# Patient Record
Sex: Female | Born: 1970 | Race: Black or African American | Hispanic: No | Marital: Single | Smoking: Never smoker
Health system: Southern US, Community
[De-identification: ages and names within clinical notes are randomized; demographics above are authoritative.]

## PROBLEM LIST (undated history)

## (undated) DIAGNOSIS — I10 Essential (primary) hypertension: Secondary | ICD-10-CM

## (undated) DIAGNOSIS — E119 Type 2 diabetes mellitus without complications: Secondary | ICD-10-CM

---

## 2017-12-08 ENCOUNTER — Emergency Department (HOSPITAL_COMMUNITY): Payer: Medicaid - Out of State

## 2017-12-08 ENCOUNTER — Other Ambulatory Visit: Payer: Self-pay

## 2017-12-08 ENCOUNTER — Encounter (HOSPITAL_COMMUNITY): Payer: Self-pay | Admitting: Emergency Medicine

## 2017-12-08 ENCOUNTER — Inpatient Hospital Stay (HOSPITAL_COMMUNITY)
Admission: EM | Admit: 2017-12-08 | Discharge: 2017-12-10 | DRG: 871 | Disposition: A | Discharge status: Home / Self Care | Payer: Medicaid - Out of State | Attending: Internal Medicine | Admitting: Internal Medicine

## 2017-12-08 DIAGNOSIS — B974 Respiratory syncytial virus as the cause of diseases classified elsewhere: Secondary | ICD-10-CM | POA: Diagnosis present

## 2017-12-08 DIAGNOSIS — I10 Essential (primary) hypertension: Secondary | ICD-10-CM

## 2017-12-08 DIAGNOSIS — J181 Lobar pneumonia, unspecified organism: Secondary | ICD-10-CM | POA: Diagnosis present

## 2017-12-08 DIAGNOSIS — Z79899 Other long term (current) drug therapy: Secondary | ICD-10-CM | POA: Diagnosis not present

## 2017-12-08 DIAGNOSIS — E876 Hypokalemia: Secondary | ICD-10-CM | POA: Diagnosis present

## 2017-12-08 DIAGNOSIS — Z6841 Body Mass Index (BMI) 40.0 and over, adult: Secondary | ICD-10-CM | POA: Diagnosis not present

## 2017-12-08 DIAGNOSIS — E119 Type 2 diabetes mellitus without complications: Secondary | ICD-10-CM | POA: Diagnosis present

## 2017-12-08 DIAGNOSIS — Z880 Allergy status to penicillin: Secondary | ICD-10-CM | POA: Diagnosis not present

## 2017-12-08 DIAGNOSIS — Z7984 Long term (current) use of oral hypoglycemic drugs: Secondary | ICD-10-CM | POA: Diagnosis not present

## 2017-12-08 DIAGNOSIS — R0602 Shortness of breath: Secondary | ICD-10-CM | POA: Diagnosis present

## 2017-12-08 DIAGNOSIS — I509 Heart failure, unspecified: Secondary | ICD-10-CM | POA: Diagnosis not present

## 2017-12-08 DIAGNOSIS — A419 Sepsis, unspecified organism: Secondary | ICD-10-CM | POA: Diagnosis present

## 2017-12-08 DIAGNOSIS — J9601 Acute respiratory failure with hypoxia: Secondary | ICD-10-CM | POA: Diagnosis present

## 2017-12-08 HISTORY — DX: Essential (primary) hypertension: I10

## 2017-12-08 HISTORY — DX: Type 2 diabetes mellitus without complications: E11.9

## 2017-12-08 LAB — CBC WITH DIFFERENTIAL/PLATELET
BASOS ABS: 0 10*3/uL (ref 0.0–0.1)
Basophils Relative: 0 %
EOS ABS: 0 10*3/uL (ref 0.0–0.7)
Eosinophils Relative: 0 %
HCT: 40.7 % (ref 36.0–46.0)
HEMOGLOBIN: 13.1 g/dL (ref 12.0–15.0)
LYMPHS ABS: 1.5 10*3/uL (ref 0.7–4.0)
LYMPHS PCT: 11 %
MCH: 29.1 pg (ref 26.0–34.0)
MCHC: 32.2 g/dL (ref 30.0–36.0)
MCV: 90.4 fL (ref 78.0–100.0)
Monocytes Absolute: 1.1 10*3/uL — ABNORMAL HIGH (ref 0.1–1.0)
Monocytes Relative: 8 %
Neutro Abs: 11.4 10*3/uL — ABNORMAL HIGH (ref 1.7–7.7)
Neutrophils Relative %: 81 %
Platelets: 362 10*3/uL (ref 150–400)
RBC: 4.5 MIL/uL (ref 3.87–5.11)
RDW: 13.4 % (ref 11.5–15.5)
WBC: 14.1 10*3/uL — AB (ref 4.0–10.5)

## 2017-12-08 LAB — I-STAT CG4 LACTIC ACID, ED: Lactic Acid, Venous: 1.31 mmol/L (ref 0.5–1.9)

## 2017-12-08 LAB — PROCALCITONIN: PROCALCITONIN: 0.22 ng/mL

## 2017-12-08 LAB — INFLUENZA PANEL BY PCR (TYPE A & B)
INFLAPCR: NEGATIVE
INFLBPCR: NEGATIVE

## 2017-12-08 LAB — PROTIME-INR
INR: 1.18
Prothrombin Time: 14.9 seconds (ref 11.4–15.2)

## 2017-12-08 LAB — BRAIN NATRIURETIC PEPTIDE: B NATRIURETIC PEPTIDE 5: 35 pg/mL (ref 0.0–100.0)

## 2017-12-08 LAB — BASIC METABOLIC PANEL
ANION GAP: 11 (ref 5–15)
BUN: 9 mg/dL (ref 6–20)
CHLORIDE: 103 mmol/L (ref 101–111)
CO2: 22 mmol/L (ref 22–32)
Calcium: 8.5 mg/dL — ABNORMAL LOW (ref 8.9–10.3)
Creatinine, Ser: 0.97 mg/dL (ref 0.44–1.00)
GFR calc Af Amer: >60 mL/min (ref >60)
GFR calc non Af Amer: >60 mL/min (ref >60)
Glucose, Bld: 126 mg/dL — ABNORMAL HIGH (ref 65–99)
Potassium: 3.6 mmol/L (ref 3.5–5.1)
SODIUM: 136 mmol/L (ref 135–145)

## 2017-12-08 LAB — LACTIC ACID, PLASMA
LACTIC ACID, VENOUS: 1.5 mmol/L (ref 0.5–1.9)
Lactic Acid, Venous: 1.3 mmol/L (ref 0.5–1.9)

## 2017-12-08 LAB — APTT: aPTT: 34 seconds (ref 24–36)

## 2017-12-08 MED ORDER — ACETAMINOPHEN 325 MG PO TABS
650.0000 mg | ORAL_TABLET | Freq: Four times a day (QID) | ORAL | Status: DC | PRN
  Administered 2017-12-09 (×2): 650 mg via ORAL
  Filled 2017-12-08 (×2): qty 2

## 2017-12-08 MED ORDER — DEXTROSE 5 % IV SOLN
500.0000 mg | Freq: Once | INTRAVENOUS | Status: AC
  Administered 2017-12-08: 500 mg via INTRAVENOUS
  Filled 2017-12-08: qty 500

## 2017-12-08 MED ORDER — ORAL CARE MOUTH RINSE
15.0000 mL | Freq: Two times a day (BID) | OROMUCOSAL | Status: DC
  Administered 2017-12-09: 15 mL via OROMUCOSAL

## 2017-12-08 MED ORDER — ONDANSETRON HCL 4 MG/2ML IJ SOLN: 4.0000 mg | Freq: Four times a day (QID) | INTRAMUSCULAR | Status: DC | PRN

## 2017-12-08 MED ORDER — ENOXAPARIN SODIUM 80 MG/0.8ML ~~LOC~~ SOLN
80.0000 mg | SUBCUTANEOUS | Status: DC
  Administered 2017-12-08 – 2017-12-09 (×2): 80 mg via SUBCUTANEOUS
  Filled 2017-12-08 (×2): qty 0.8

## 2017-12-08 MED ORDER — ALBUTEROL SULFATE (2.5 MG/3ML) 0.083% IN NEBU
5.0000 mg | INHALATION_SOLUTION | Freq: Once | RESPIRATORY_TRACT | Status: AC
  Administered 2017-12-08: 5 mg via RESPIRATORY_TRACT
  Filled 2017-12-08: qty 6

## 2017-12-08 MED ORDER — CEFTRIAXONE SODIUM 1 G IJ SOLR
1.0000 g | Freq: Once | INTRAMUSCULAR | Status: AC
  Administered 2017-12-08: 1 g via INTRAVENOUS
  Filled 2017-12-08: qty 10

## 2017-12-08 MED ORDER — ONDANSETRON HCL 4 MG PO TABS: 4.0000 mg | ORAL_TABLET | Freq: Four times a day (QID) | ORAL | Status: DC | PRN

## 2017-12-08 MED ORDER — IPRATROPIUM-ALBUTEROL 0.5-2.5 (3) MG/3ML IN SOLN
3.0000 mL | Freq: Once | RESPIRATORY_TRACT | Status: AC
  Administered 2017-12-08: 3 mL via RESPIRATORY_TRACT
  Filled 2017-12-08: qty 3

## 2017-12-08 MED ORDER — DIPHENHYDRAMINE HCL 50 MG/ML IJ SOLN
12.5000 mg | Freq: Once | INTRAMUSCULAR | Status: AC
  Administered 2017-12-08: 12.5 mg via INTRAVENOUS
  Filled 2017-12-08: qty 1

## 2017-12-08 MED ORDER — ALBUTEROL SULFATE (2.5 MG/3ML) 0.083% IN NEBU
2.5000 mg | INHALATION_SOLUTION | Freq: Once | RESPIRATORY_TRACT | Status: AC
  Administered 2017-12-08: 2.5 mg via RESPIRATORY_TRACT
  Filled 2017-12-08: qty 3

## 2017-12-08 MED ORDER — IPRATROPIUM-ALBUTEROL 0.5-2.5 (3) MG/3ML IN SOLN
3.0000 mL | Freq: Four times a day (QID) | RESPIRATORY_TRACT | Status: DC
  Administered 2017-12-08 – 2017-12-10 (×7): 3 mL via RESPIRATORY_TRACT
  Filled 2017-12-08 (×7): qty 3

## 2017-12-08 MED ORDER — METOPROLOL TARTRATE 50 MG PO TABS
50.0000 mg | ORAL_TABLET | Freq: Two times a day (BID) | ORAL | Status: DC
  Administered 2017-12-08 – 2017-12-10 (×4): 50 mg via ORAL
  Filled 2017-12-08 (×4): qty 1

## 2017-12-08 MED ORDER — ACETAMINOPHEN 500 MG PO TABS
1000.0000 mg | ORAL_TABLET | Freq: Once | ORAL | Status: AC
  Administered 2017-12-08: 1000 mg via ORAL
  Filled 2017-12-08: qty 2

## 2017-12-08 MED ORDER — DEXTROSE 5 % IV SOLN: 1.0000 g | Freq: Once | INTRAVENOUS | Status: DC

## 2017-12-08 MED ORDER — AMLODIPINE BESYLATE 5 MG PO TABS
10.0000 mg | ORAL_TABLET | Freq: Every day | ORAL | Status: DC
  Administered 2017-12-09 – 2017-12-10 (×2): 10 mg via ORAL
  Filled 2017-12-08 (×2): qty 2

## 2017-12-08 MED ORDER — DEXTROSE 5 % IV SOLN: 500.0000 mg | Freq: Once | INTRAVENOUS | Status: DC

## 2017-12-08 MED ORDER — DEXTROSE 5 % IV SOLN
1.0000 g | INTRAVENOUS | Status: DC
  Administered 2017-12-09: 1 g via INTRAVENOUS
  Filled 2017-12-08 (×4): qty 10

## 2017-12-08 MED ORDER — VITAMIN D 1000 UNITS PO TABS
2000.0000 [IU] | ORAL_TABLET | Freq: Every day | ORAL | Status: DC
  Administered 2017-12-08 – 2017-12-10 (×3): 2000 [IU] via ORAL
  Filled 2017-12-08 (×3): qty 2

## 2017-12-08 MED ORDER — FUROSEMIDE 10 MG/ML IJ SOLN
40.0000 mg | Freq: Once | INTRAMUSCULAR | Status: AC
  Administered 2017-12-08: 40 mg via INTRAVENOUS
  Filled 2017-12-08: qty 4

## 2017-12-08 MED ORDER — AZITHROMYCIN 500 MG IV SOLR
500.0000 mg | INTRAVENOUS | Status: DC
  Administered 2017-12-09: 500 mg via INTRAVENOUS
  Filled 2017-12-08 (×3): qty 500

## 2017-12-08 MED ORDER — PROCHLORPERAZINE EDISYLATE 5 MG/ML IJ SOLN
5.0000 mg | Freq: Once | INTRAMUSCULAR | Status: AC
  Administered 2017-12-08: 5 mg via INTRAVENOUS
  Filled 2017-12-08: qty 2

## 2017-12-08 MED ORDER — ACETAMINOPHEN 650 MG RE SUPP: 650.0000 mg | Freq: Four times a day (QID) | RECTAL | Status: DC | PRN

## 2017-12-08 NOTE — ED Triage Notes (Signed)
Patient complaining of cough, wheezing, and shortness of breath x 1 week. States she is coughing up white phlegm.

## 2017-12-08 NOTE — ED Provider Notes (Signed)
[facility] EMERGENCY DEPARTMENT Provider Note   CSN: [id number] Arrival date & time: 12/08/17  1242     History   Chief Complaint Chief Complaint  Patient presents with  . Respiratory Distress    HPI Misty Delgado is a 47 y.o. female.  Planes of shortness of breath and cough productive of white sputum for the past 7 days.  She was seen at a clinic a few days ago, started on erythromycin, without relief.  She denies any nausea or vomiting.  Associated symptoms include fever.  No other associated symptoms.  Patient was started on erythromycin 3 days ago for same complaint.  HPI  Past Medical History:  Diagnosis Date  . Diabetes mellitus without complication (HCC)   . Hypertension     There are no active problems to display for this patient.   History reviewed. No pertinent surgical history.  OB History    No data available       Home Medications    Prior to Admission medications   Medication Sig Start Date End Date Taking? Authorizing Provider  AmLODIPine Besylate (NORVASC PO) amlodipine    [provider]  azithromycin (ZITHROMAX) 250 MG tablet Take 1 tablet by mouth as directed. 12/07/17   [provider]  benzonatate (TESSALON) 100 MG capsule  12/06/17   [provider]  doxycycline (VIBRA-TABS) 100 MG tablet Take 1 tablet by mouth 2 (two) times daily.    [provider]  GlipiZIDE (GLUCOTROL PO) glipizide    [provider]  Metoprolol-Hydrochlorothiazide 100-12.5 MG TB24 Take 1 tablet by mouth daily.    [provider]  PROAIR HFA 108 [phone] Base) MCG/ACT inhaler  12/07/17   [provider]  promethazine-dextromethorphan (PROMETHAZINE-DM) 6.25-15 MG/5ML syrup  12/07/17   [provider]    Family History History reviewed. No pertinent family history.  Social History Social History   Tobacco Use  . Smoking status: Never Smoker  . Smokeless tobacco: Never Used  Substance Use Topics  .  Alcohol use: No    Frequency: Never  . Drug use: No     Allergies   Penicillins   Review of Systems Review of Systems  Constitutional: Negative.   HENT: Negative.   Respiratory: Positive for cough and shortness of breath.   Cardiovascular: Positive for leg swelling.       Chronic bilateral leg edema  Gastrointestinal: Negative.   Musculoskeletal: Negative.   Skin: Negative.   Allergic/Immunologic: Positive for immunocompromised state.  Neurological: Negative.   Psychiatric/Behavioral: Negative.   All other systems reviewed and are negative.    Physical Exam Updated Vital Signs BP (!) 190/103   Pulse (!) 103   Temp (!) 103.9 F (39.9 C) (Rectal)   Resp (!) 25   Ht 5\' 4"  (1.626 m)   Wt (!) 161.9 kg (357 lb)   LMP 11/22/2017   SpO2 97%   BMI 61.28 kg/m   Physical Exam  Constitutional: She appears well-developed and well-nourished. She appears distressed.  Moderately ill-appearing  HENT:  Head: Normocephalic and atraumatic.  Eyes: Conjunctivae are normal. Pupils are equal, round, and reactive to light.  Neck: Neck supple. No tracheal deviation present. No thyromegaly present.  Cardiovascular: Regular rhythm.  No murmur heard. Tachycardic  Pulmonary/Chest: She has wheezes.  Coughing frequently.  Expiratory wheezes  Abdominal: Soft. Bowel sounds are normal. She exhibits no distension. There is no tenderness.  Morbidly obese  Musculoskeletal: Normal range of motion. She exhibits edema. She exhibits no tenderness.  Plus 1pretibial pitting edema bilaterally  Neurological: She is alert. Coordination normal.  Skin: Skin is warm and dry. No rash noted.  Psychiatric: She has a normal mood and affect.  Nursing note and vitals reviewed.    ED Treatments / Results  Labs (all labs ordered are listed, but only abnormal results are displayed) Labs Reviewed  CULTURE, BLOOD (ROUTINE X 2)  CULTURE, BLOOD (ROUTINE X 2)  BRAIN NATRIURETIC PEPTIDE  CBC WITH  DIFFERENTIAL/PLATELET  BASIC METABOLIC PANEL  URINALYSIS, ROUTINE W REFLEX MICROSCOPIC  INFLUENZA PANEL BY PCR (TYPE A & B)  I-STAT CG4 LACTIC ACID, ED    EKG  EKG Interpretation  Date/Time:  Thursday December 08 2017 13:05:00 EST Ventricular Rate:  101 PR Interval:    QRS Duration: 85 QT Interval:  329 QTC Calculation: 427 R Axis:   70 Text Interpretation:  Sinus tachycardia No old tracing to compare Confirmed by [place]ubowitz, Doreatha MartinSam [id number]) on 12/08/2017 1:28:50 PM       Radiology No results found.  Procedures Procedures (including critical care time)  Medications Ordered in ED Medications  albuterol (PROVENTIL) (2.5 MG/3ML) 0.083% nebulizer solution 5 mg (not administered)  cefTRIAXone (ROCEPHIN) 1 g in dextrose 5 % 50 mL IVPB (not administered)  azithromycin (ZITHROMAX) 500 mg in dextrose 5 % 250 mL IVPB (not administered)  acetaminophen (TYLENOL) tablet 1,000 mg (not administered)  ipratropium-albuterol (DUONEB) 0.5-2.5 (3) MG/3ML nebulizer solution 3 mL (3 mLs Nebulization Given 12/08/17 1305)  albuterol (PROVENTIL) (2.5 MG/3ML) 0.083% nebulizer solution 2.5 mg (2.5 mg Nebulization Given 12/08/17 1305)   Chest x-ray viewed by me Results for orders placed or performed during the hospital encounter of 12/08/17  Brain natriuretic peptide  Result Value Ref Range   B Natriuretic Peptide 35.0 0.0 - 100.0 pg/mL  CBC with Differential/Platelet  Result Value Ref Range   WBC 14.1 (H) 4.0 - 10.5 K/uL   RBC 4.50 3.87 - 5.11 MIL/uL   Hemoglobin 13.1 12.0 - 15.0 g/dL   HCT 47.840.7 29.536.0 - 62.146.0 %   MCV 90.4 78.0 - 100.0 fL   MCH 29.1 26.0 - 34.0 pg   MCHC 32.2 30.0 - 36.0 g/dL   RDW 30.813.4 65.711.5 - 84.615.5 %   Platelets 362 150 - 400 K/uL   Neutrophils Relative % 81 %   Neutro Abs 11.4 (H) 1.7 - 7.7 K/uL   Lymphocytes Relative 11 %   Lymphs Abs 1.5 0.7 - 4.0 K/uL   Monocytes Relative 8 %   Monocytes Absolute 1.1 (H) 0.1 - 1.0 K/uL   Eosinophils Relative 0 %   Eosinophils Absolute 0.0 0.0  - 0.7 K/uL   Basophils Relative 0 %   Basophils Absolute 0.0 0.0 - 0.1 K/uL  Basic metabolic panel  Result Value Ref Range   Sodium 136 135 - 145 mmol/L   Potassium 3.6 3.5 - 5.1 mmol/L   Chloride 103 101 - 111 mmol/L   CO2 22 22 - 32 mmol/L   Glucose, Bld 126 (H) 65 - 99 mg/dL   BUN 9 6 - 20 mg/dL   Creatinine, Ser 9.620.97 0.44 - 1.00 mg/dL   Calcium 8.5 (L) 8.9 - 10.3 mg/dL   GFR calc non Af Amer >60 >60 mL/min   GFR calc Af Amer >60 >60 mL/min   Anion gap 11 5 - 15  I-Stat CG4 Lactic Acid, ED  ([facility])  Result Value Ref Range   Lactic Acid, Venous 1.31 0.5 - 1.9 mmol/L   Dg Chest  2 View  Result Date: 12/08/2017 CLINICAL DATA:  Cough, shortness of breath. EXAM: CHEST  2 VIEW COMPARISON:  None. FINDINGS: Borderline cardiac enlargement is noted with central pulmonary vascular congestion. No pneumothorax or pleural effusion is noted. Mild right basilar subsegmental atelectasis or infiltrate is noted. Bony thorax is unremarkable. IMPRESSION: Central pulmonary vascular congestion is noted. Mild right basilar atelectasis or infiltrate is noted. Followup PA and lateral chest X-ray is recommended in 3-4 weeks following trial of antibiotic therapy to ensure resolution and exclude underlying malignancy. Electronically Signed   By: Lupita Raider, M.D.   On: 12/08/2017 14:05    Initial Impression / Assessment and Plan / ED Course  I have reviewed the triage vital signs and the nursing notes.  Pertinent labs & imaging results that were available during my care of the patient were reviewed by me and considered in my medical decision making (see chart for details).    Code sepsis called based on sirs criteria fever, tachycardia.  Source of infection felt to be respiratory  pulseOximetry on room air consistent with hypoxia Sepsis - Repeat Assessment  Performed at:    3pm  Vitals     Blood pressure (!) 180/90, pulse 100, temperature (!) 103.9 F (39.9 C), temperature source Rectal, resp.  rate (!) 25, height 5\' 4"  (1.626 m), weight (!) 161.9 kg (357 lb), last menstrual period 11/22/2017, SpO2 98 %.  Heart:     Tachycardic  Lungs:    Wheezing  Capillary Refill:   <2 sec  Peripheral Pulse:   Radial pulse palpable  Skin:     Normal Color  3P.m. patient feels improved after treatment with albuterol nebulizer and IV antibiotics  Dr.Tat hospitalist service consulted and will arrange for admission to telemetry  Final Clinical Impressions(s) / ED Diagnoses  Diagnosis #1 sepsis #2 community-acquired pneumonia #3 hypoxia CRITICAL CARE Performed by: Doug Sou Total critical care time: 30 minutes Critical care time was exclusive of separately billable procedures and treating other patients. Critical care was necessary to treat or prevent imminent or life-threatening deterioration. Critical care was time spent personally by me on the following activities: development of treatment plan with patient and/or surrogate as well as nursing, discussions with consultants, evaluation of patient's response to treatment, examination of patient, obtaining history from patient or surrogate, ordering and performing treatments and interventions, ordering and review of laboratory studies, ordering and review of radiographic studies, pulse oximetry and re-evaluation of patient's condition. Final diagnoses:  None    ED Discharge Orders    None       Doug Sou, MD 12/08/17 1510

## 2017-12-08 NOTE — ED Notes (Signed)
Patient transported to X-ray 

## 2017-12-08 NOTE — H&P (Signed)
History and Physical  Misty Delgado ZOX:[id number] DOB: 06-12-1971 DOA: 12/08/2017   PCP: System, Pcp Not In   Patient coming from: Home  Chief Complaint: sob, cough  HPI:  Misty Delgado is a 47 y.o. female with medical history of hypertension, diabetes mellitus, and morbid obesity presenting with 3-day history of shortness of breath, coughing, fevers and chills.  The patient went to urgent care on 12/06/2017.  The patient was given anti-tussive and doxycycline.  She subsequently followed up with her primary care provider on 12/07/2017 who started her on azithromycin.  The patient continued to have a nonproductive cough with shortness of breath.  As result, she presented for further evaluation.  In addition, she has had orthopnea and PND type symptoms.  She has had to sleep sitting up for the past 2-3 nights.  The patient also endorses increasing lower extremity edema over the past month.  She denies any headache, neck pain, hemoptysis, nausea, vomiting, diarrhea, dysuria, hematuria.  She has some abdominal pain with coughing.  The patient had loose stools on 12/07/2017, but has not had any bowel movements  on the day of admission.  She has never smoked any tobacco.  She denies any sick contacts.  In the emergency department, the patient was noted to have oxygen saturation of 84% on room air.  Oxygen saturation improved to 96% on 3 L.  She was noted to have a fever 103.9 F with tachycardia up to 110.  She was started on ceftriaxone and azithromycin for concerning pneumonia.  Chest x-ray showed right lower lobe opacity and pulmonary vascular congestion.   Assessment/Plan: Sepsis -Secondary to pneumonia -Check lactic acid 1.31 -Check procalcitonin -Judicious IV fluids as the patient appears to be volume overloaded -Continue ceftriaxone and azithromycin pending culture data   Acute respiratory failure with hypoxia -Secondary to pneumonia with a degree of pulmonary vascular  congestion -Lasix 40 mg IV x1 and reevaluate 12/09/2017 for further dosing -Check BNP--35 -Presently stable on 3 L nasal cannula -Echo  Lobar pneumonia -Continue azithromycin and ceftriaxone -Start bronchodilators  Essential hypertension  -continue amlodipine and metoprolol tartrate -holding HCTZ  Diabetes mellitus type 2 -Hemoglobin A1c -NovoLog sliding scale -Holding glipizide  Morbid obesity -BMI 61.25          Past Medical History:  Diagnosis Date  . Diabetes mellitus without complication (HCC)   . Hypertension    History reviewed. No pertinent surgical history. Social History:  reports that  has never smoked. she has never used smokeless tobacco. She reports that she does not drink alcohol or use drugs.   History reviewed. No pertinent family history.   Allergies  Allergen Reactions  . Penicillins Hives    Has patient had a PCN reaction causing immediate rash, facial/tongue/throat swelling, SOB or lightheadedness with hypotension: No Has patient had a PCN reaction causing severe rash involving mucus membranes or skin necrosis: No Has patient had a PCN reaction that required hospitalization: Yes Has patient had a PCN reaction occurring within the last 10 years: No If all of the above answers are "NO", then may proceed with Cephalosporin use.      Prior to Admission medications   Medication Sig Start Date End Date Taking? Authorizing Provider  amLODipine (NORVASC) 10 MG tablet take 1 tablet daily   Yes [provider]  azithromycin (ZITHROMAX) 250 MG tablet Take 1 tablet by mouth as directed. 12/07/17  Yes [provider]  benzonatate (TESSALON) 100 MG capsule Take 100 mg  by mouth.  12/06/17  Yes [provider]  cholecalciferol (VITAMIN D) 1000 units tablet Take 2,000 Units by mouth daily.   Yes [provider]  doxycycline (VIBRA-TABS) 100 MG tablet Take 1 tablet by mouth 2 (two) times daily.   Yes [provider]  GlipiZIDE (GLUCOTROL PO) take 1 tablet daily   Yes [provider]  LISINOPRIL PO Take 1 tablet by mouth daily.   Yes [provider]  Metoprolol-Hydrochlorothiazide 100-12.5 MG TB24 Take 1 tablet by mouth daily.   Yes [provider]  PROAIR HFA 108 [phone] Base) MCG/ACT inhaler  12/07/17  Yes [provider]  Vitamin D, Ergocalciferol, (DRISDOL) 50000 units CAPS capsule Take 50,000 Units by mouth as directed. Twice a week   Yes [provider]    Review of Systems:  Constitutional:  No weight loss, night sweats Head&Eyes: No headache.  No vision loss.  No eye pain or scotoma ENT:  No Difficulty swallowing,Tooth/dental problems,Sore throat,  No ear ache, post nasal drip,  Cardio-vascular:  No chest pain, dizziness, palpitations  GI:  No  abdominal pain, nausea, vomiting, diarrhea, loss of appetite, hematochezia, melena, heartburn, indigestion, Resp:   No coughing up of blood .No wheezing.No chest wall deformity  Skin:  no rash or lesions.  GU:  no dysuria, change in color of urine, no urgency or frequency. No flank pain.  Musculoskeletal:  No joint pain or swelling. No decreased range of motion. No back pain.  Psych:  No change in mood or affect. No depression or anxiety. Neurologic: No headache, no dysesthesia, no focal weakness, no vision loss. No syncope  Physical Exam: Vitals:   12/08/17 1400 12/08/17 1405 12/08/17 1430 12/08/17 1500  BP: (!) 180/90  (!) 181/95 (!) 170/87  Pulse: 100  (!) 106 99  Resp:      Temp:      TempSrc:      SpO2: 97% 98% 96% 94%  Weight:      Height:       General:  A&O x 3, NAD, nontoxic, pleasant/cooperative Head/Eye: No conjunctival hemorrhage, no icterus, [place]/AT, No nystagmus ENT:  No icterus,  No thrush, good dentition, no pharyngeal exudate Neck:  No masses, no lymphadenpathy, no bruits CV:  RRR, no rub, no gallop, no S3 Lung: Bibasilar crackles.  No wheezing.  Good air  movement Abdomen: soft/NT, +BS, nondistended, no peritoneal signs Ext: No cyanosis, No rashes, No petechiae, No lymphangitis, 2 + LE edema Neuro: CNII-XII intact, strength 4/5 in bilateral upper and lower extremities, no dysmetria  Labs on Admission:  Basic Metabolic Panel: Recent Labs  Lab 12/08/17 1357  NA 136  K 3.6  CL 103  CO2 22  GLUCOSE 126*  BUN 9  CREATININE 0.97  CALCIUM 8.5*   Liver Function Tests: No results for input(s): AST, ALT, ALKPHOS, BILITOT, PROT, ALBUMIN in the last 168 hours. No results for input(s): LIPASE, AMYLASE in the last 168 hours. No results for input(s): AMMONIA in the last 168 hours. CBC: Recent Labs  Lab 12/08/17 1357  WBC 14.1*  NEUTROABS 11.4*  HGB 13.1  HCT 40.7  MCV 90.4  PLT 362   Coagulation Profile: No results for input(s): INR, PROTIME in the last 168 hours. Cardiac Enzymes: No results for input(s): CKTOTAL, CKMB, CKMBINDEX, TROPONINI in the last 168 hours. BNP: Invalid input(s): POCBNP CBG: No results for input(s): GLUCAP in the last 168 hours. Urine analysis: No results found for: COLORURINE, APPEARANCEUR, LABSPEC, PHURINE, GLUCOSEU, HGBUR,  BILIRUBINUR, KETONESUR, PROTEINUR, UROBILINOGEN, NITRITE, LEUKOCYTESUR Sepsis Labs: @LABRCNTIP (procalcitonin:4,lacticidven:4) )No results found for this or any previous visit (from the past 240 hour(s)).   Radiological Exams on Admission: Dg Chest 2 View  Result Date: 12/08/2017 CLINICAL DATA:  Cough, shortness of breath. EXAM: CHEST  2 VIEW COMPARISON:  None. FINDINGS: Borderline cardiac enlargement is noted with central pulmonary vascular congestion. No pneumothorax or pleural effusion is noted. Mild right basilar subsegmental atelectasis or infiltrate is noted. Bony thorax is unremarkable. IMPRESSION: Central pulmonary vascular congestion is noted. Mild right basilar atelectasis or infiltrate is noted. Followup PA and lateral chest X-ray is recommended in 3-4 weeks following trial of  antibiotic therapy to ensure resolution and exclude underlying malignancy. Electronically Signed   By: Lupita Raider, M.D.   On: 12/08/2017 14:05    EKG: Independently reviewed.  Sinus tachycardia, no ST-T wave changes    Time spent:70 minutes Code Status:   FULL Family Communication:  No Family at bedside Disposition Plan: expect 2-3 day hospitalization Consults called: none DVT Prophylaxis: [place] Lovenox  Catarina Hartshorn, DO  [facility] Pager [phone]  If 7PM-7AM, please contact night-coverage [email or web address] Password [email or web address] 12/08/2017, 3:16 PM

## 2017-12-09 ENCOUNTER — Other Ambulatory Visit (HOSPITAL_COMMUNITY): Payer: Medicaid - Out of State

## 2017-12-09 LAB — RESPIRATORY PANEL BY PCR
Adenovirus: NOT DETECTED
BORDETELLA PERTUSSIS-RVPCR: NOT DETECTED
CHLAMYDOPHILA PNEUMONIAE-RVPPCR: NOT DETECTED
CORONAVIRUS HKU1-RVPPCR: NOT DETECTED
Coronavirus 229E: NOT DETECTED
Coronavirus NL63: NOT DETECTED
Coronavirus OC43: DETECTED — AB
INFLUENZA A-RVPPCR: NOT DETECTED
INFLUENZA B-RVPPCR: NOT DETECTED
METAPNEUMOVIRUS-RVPPCR: NOT DETECTED
Mycoplasma pneumoniae: NOT DETECTED
PARAINFLUENZA VIRUS 2-RVPPCR: NOT DETECTED
PARAINFLUENZA VIRUS 3-RVPPCR: NOT DETECTED
Parainfluenza Virus 1: NOT DETECTED
Parainfluenza Virus 4: NOT DETECTED
RESPIRATORY SYNCYTIAL VIRUS-RVPPCR: DETECTED — AB
RHINOVIRUS / ENTEROVIRUS - RVPPCR: NOT DETECTED

## 2017-12-09 LAB — COMPREHENSIVE METABOLIC PANEL
ALBUMIN: 2.9 g/dL — AB (ref 3.5–5.0)
ALK PHOS: 100 U/L (ref 38–126)
ALT: 29 U/L (ref 14–54)
ANION GAP: 12 (ref 5–15)
AST: 53 U/L — AB (ref 15–41)
BILIRUBIN TOTAL: 1 mg/dL (ref 0.3–1.2)
BUN: 13 mg/dL (ref 6–20)
CALCIUM: 8.2 mg/dL — AB (ref 8.9–10.3)
CO2: 23 mmol/L (ref 22–32)
Chloride: 99 mmol/L — ABNORMAL LOW (ref 101–111)
Creatinine, Ser: 1.01 mg/dL — ABNORMAL HIGH (ref 0.44–1.00)
GFR calc Af Amer: >60 mL/min (ref >60)
GFR calc non Af Amer: >60 mL/min (ref >60)
GLUCOSE: 102 mg/dL — AB (ref 65–99)
POTASSIUM: 3.2 mmol/L — AB (ref 3.5–5.1)
SODIUM: 134 mmol/L — AB (ref 135–145)
TOTAL PROTEIN: 8.5 g/dL — AB (ref 6.5–8.1)

## 2017-12-09 LAB — CBC
HEMATOCRIT: 40.6 % (ref 36.0–46.0)
HEMOGLOBIN: 13 g/dL (ref 12.0–15.0)
MCH: 29.1 pg (ref 26.0–34.0)
MCHC: 32 g/dL (ref 30.0–36.0)
MCV: 90.8 fL (ref 78.0–100.0)
Platelets: 356 10*3/uL (ref 150–400)
RBC: 4.47 MIL/uL (ref 3.87–5.11)
RDW: 13.5 % (ref 11.5–15.5)
WBC: 11 10*3/uL — ABNORMAL HIGH (ref 4.0–10.5)

## 2017-12-09 LAB — HEMOGLOBIN A1C
Hgb A1c MFr Bld: 5.7 % — ABNORMAL HIGH (ref 4.8–5.6)
MEAN PLASMA GLUCOSE: 116.89 mg/dL

## 2017-12-09 MED ORDER — AZITHROMYCIN 250 MG PO TABS
500.0000 mg | ORAL_TABLET | ORAL | Status: DC
  Administered 2017-12-10: 500 mg via ORAL
  Filled 2017-12-09: qty 2

## 2017-12-09 MED ORDER — POTASSIUM CHLORIDE CRYS ER 20 MEQ PO TBCR
40.0000 meq | EXTENDED_RELEASE_TABLET | Freq: Once | ORAL | Status: AC
  Administered 2017-12-09: 40 meq via ORAL
  Filled 2017-12-09: qty 2

## 2017-12-09 NOTE — Progress Notes (Signed)
CRITICAL VALUE ALERT  Critical Value:  Resp panel RSV  Date & Time Notied:  12/09/16 1643  Provider Notified: TAT  Orders Received/Actions taken:

## 2017-12-09 NOTE — Progress Notes (Addendum)
PROGRESS NOTE  Nickolette Espinola WUJ:[id number] DOB: 07-21-1971 DOA: 12/08/2017 PCP: System, Pcp Not In  Brief History:   Misty Delgado is a 47 y.o. female with medical history of hypertension, diabetes mellitus, and morbid obesity presenting with 3-day history of shortness of breath, coughing, fevers and chills. The patient went to urgent care on 12/06/2017. The patient was given anti-tussive and doxycycline.She subsequently followed up with her primary care provider on 12/07/2017 who started her on azithromycin. The patient continued to have a nonproductive cough with shortness of breath. As result, she presented for further evaluation. In addition, she has had orthopnea and PND type symptoms. She has had to sleep sitting up for the past 2-3 nights. The patient also endorses increasing lower extremity edema over the past month. She denies any headache, neck pain, hemoptysis, nausea, vomiting, diarrhea, dysuria, hematuria. She has some abdominal pain with coughing.   In the emergency department, the patient was noted to have oxygen saturation of 84% on room air. Oxygen saturation improved to 96% on 3 L. She was noted to have a fever 103.9 F with tachycardia up to 110. She was started on ceftriaxone and azithromycin for concerning pneumonia. Chest x-ray showed right lower lobe opacity and pulmonary vascular congestion.  In retrospect, the patient stated that she has been around her grandson who was recently diagnosed with RSV.    Assessment/Plan: Sepsis -Secondary to pneumonia -Check lactic acid1.31 -Check procalcitonin--0.22 -presented with fever and tachycardia -12/08/17--blood cultures negative to date  Acute respiratory failure with hypoxia -Secondary to pneumonia with a degree of pulmonary vascular congestion -Lasix 40 mg IV x1 and reevaluate 1/4/2019forfurther dosing -Check BNP--35 -Presently stable on 2 L nasal cannula -Echo--pending  Lobar  pneumonia -suspect viral -viral respiratory panel +RSV -PCT --0.22 -Discontinue ceftriaxone -one more day azithromycin--3 days total -Continue bronchodilators  Essential hypertension -continue amlodipineand metoprolol tartrate -holding HCTZ  Diabetes mellitus type 2 -Hemoglobin A1c--5.7 -NovoLog sliding scale -Holding glipizide  Morbid obesity -BMI 61.25  Hypokalemia -replete -check mag     Disposition Plan:   Home1/5 or 1/6 if stable Family Communication: No  Family at bedside  Consultants:  none  Code Status:  FULL  DVT Prophylaxis:  [place] Lovenox   Procedures: As Listed in Progress Note Above  Antibiotics: Ceftriaxone 1/3>>1/4 Azithromycin 1/3>>>   Subjective: Patient is breathing better.  She denies any chest pain, nausea, vomiting, diarrhea, abdominal pain.  No dysuria hematuria.  No headache or neck pain.  Objective: Vitals:   12/09/17 0203 12/09/17 0500 12/09/17 0752 12/09/17 1500  BP:  (!) 192/87  (!) 164/84  Pulse:  91  83  Resp:  (!) 21  20  Temp:  99 F (37.2 C)  99.5 F (37.5 C)  TempSrc:  Oral  Axillary  SpO2: 97% 97% 95% 93%  Weight:  (!) 156.5 kg (345 lb)    Height:        Intake/Output Summary (Last 24 hours) at 12/09/2017 1753 Last data filed at 12/09/2017 1200 Gross per 24 hour  Intake 360 ml  Output -  Net 360 ml   Weight change:  Exam:   General:  Pt is alert, follows commands appropriately, not in acute distress  HEENT: No icterus, No thrush, No neck mass, [place]/AT  Cardiovascular: RRR, S1/S2, no rubs, no gallops  Respiratory: Bibasilar crackles.  No wheezing.  Good air movement.  Abdomen: Soft/+BS, non tender, non distended, no guarding  Extremities: 1 + LE edema,  No lymphangitis, No petechiae, No rashes, no synovitis   Data Reviewed: I have personally reviewed following labs and imaging studies Basic Metabolic Panel: Recent Labs  Lab 12/08/17 1357 12/09/17 0458  NA 136 134*  K 3.6 3.2*  CL 103 99*  CO2  22 23  GLUCOSE 126* 102*  BUN 9 13  CREATININE 0.97 1.01*  CALCIUM 8.5* 8.2*   Liver Function Tests: Recent Labs  Lab 12/09/17 0458  AST 53*  ALT 29  ALKPHOS 100  BILITOT 1.0  PROT 8.5*  ALBUMIN 2.9*   No results for input(s): LIPASE, AMYLASE in the last 168 hours. No results for input(s): AMMONIA in the last 168 hours. Coagulation Profile: Recent Labs  Lab 12/08/17 1413  INR 1.18   CBC: Recent Labs  Lab 12/08/17 1357 12/09/17 0458  WBC 14.1* 11.0*  NEUTROABS 11.4*  --   HGB 13.1 13.0  HCT 40.7 40.6  MCV 90.4 90.8  PLT 362 356   Cardiac Enzymes: No results for input(s): CKTOTAL, CKMB, CKMBINDEX, TROPONINI in the last 168 hours. BNP: Invalid input(s): POCBNP CBG: No results for input(s): GLUCAP in the last 168 hours. HbA1C: Recent Labs    12/08/17 1413  HGBA1C 5.7*   Urine analysis: No results found for: COLORURINE, APPEARANCEUR, LABSPEC, PHURINE, GLUCOSEU, HGBUR, BILIRUBINUR, KETONESUR, PROTEINUR, UROBILINOGEN, NITRITE, LEUKOCYTESUR Sepsis Labs: @LABRCNTIP (procalcitonin:4,lacticidven:4) ) Recent Results (from the past 240 hour(s))  Blood Culture (routine x 2)     Status: None (Preliminary result)   Collection Time: 12/08/17  1:59 PM  Result Value Ref Range Status   Specimen Description BLOOD RIGHT ANTECUBITAL  Final   Special Requests   Final    BOTTLES DRAWN AEROBIC AND ANAEROBIC Blood Culture adequate volume   Culture NO GROWTH < 24 HOURS  Final   Report Status PENDING  Incomplete  Blood Culture (routine x 2)     Status: None (Preliminary result)   Collection Time: 12/08/17  2:13 PM  Result Value Ref Range Status   Specimen Description BLOOD LEFT ANTECUBITAL  Final   Special Requests   Final    BOTTLES DRAWN AEROBIC ONLY Blood Culture adequate volume   Culture NO GROWTH < 24 HOURS  Final   Report Status PENDING  Incomplete  Respiratory Panel by PCR     Status: Abnormal   Collection Time: 12/08/17  8:37 PM  Result Value Ref Range Status    Adenovirus NOT DETECTED NOT DETECTED Final   Coronavirus 229E NOT DETECTED NOT DETECTED Final   Coronavirus HKU1 NOT DETECTED NOT DETECTED Final   Coronavirus NL63 NOT DETECTED NOT DETECTED Final   Coronavirus OC43 DETECTED (A) NOT DETECTED Final   Metapneumovirus NOT DETECTED NOT DETECTED Final   Rhinovirus / Enterovirus NOT DETECTED NOT DETECTED Final   Influenza A NOT DETECTED NOT DETECTED Final   Influenza B NOT DETECTED NOT DETECTED Final   Parainfluenza Virus 1 NOT DETECTED NOT DETECTED Final   Parainfluenza Virus 2 NOT DETECTED NOT DETECTED Final   Parainfluenza Virus 3 NOT DETECTED NOT DETECTED Final   Parainfluenza Virus 4 NOT DETECTED NOT DETECTED Final   Respiratory Syncytial Virus DETECTED (A) NOT DETECTED Final    Comment: CRITICAL RESULT CALLED TO, READ BACK BY AND VERIFIED WITH: A HOBBS,RN AT 1640 12/09/17 BY L BENFIELD    Bordetella pertussis NOT DETECTED NOT DETECTED Final   Chlamydophila pneumoniae NOT DETECTED NOT DETECTED Final   Mycoplasma pneumoniae NOT DETECTED NOT DETECTED Final    Comment: Performed at [facility],  [address] [place], [place] [address]     Scheduled Meds: . amLODipine  10 mg Oral Daily  . cholecalciferol  2,000 Units Oral Daily  . enoxaparin (LOVENOX) injection  80 mg Subcutaneous Q24H  . ipratropium-albuterol  3 mL Nebulization Q6H  . mouth rinse  15 mL Mouth Rinse BID  . metoprolol tartrate  50 mg Oral BID   Continuous Infusions: . azithromycin Stopped (12/09/17 1601)  . cefTRIAXone (ROCEPHIN)  IV Stopped (12/09/17 1632)    Procedures/Studies: Dg Chest 2 View  Result Date: 12/08/2017 CLINICAL DATA:  Cough, shortness of breath. EXAM: CHEST  2 VIEW COMPARISON:  None. FINDINGS: Borderline cardiac enlargement is noted with central pulmonary vascular congestion. No pneumothorax or pleural effusion is noted. Mild right basilar subsegmental atelectasis or infiltrate is noted. Bony thorax is unremarkable. IMPRESSION: Central  pulmonary vascular congestion is noted. Mild right basilar atelectasis or infiltrate is noted. Followup PA and lateral chest X-ray is recommended in 3-4 weeks following trial of antibiotic therapy to ensure resolution and exclude underlying malignancy. Electronically Signed   By: Lupita Raider, M.D.   On: 12/08/2017 14:05    Catarina Hartshorn, DO  [facility] Pager [phone]  If 7PM-7AM, please contact night-coverage [email or web address] Password [email or web address] 12/09/2017, 5:53 PM   LOS: 1 day

## 2017-12-10 ENCOUNTER — Inpatient Hospital Stay (HOSPITAL_COMMUNITY): Payer: Medicaid - Out of State

## 2017-12-10 DIAGNOSIS — I509 Heart failure, unspecified: Secondary | ICD-10-CM

## 2017-12-10 LAB — CBC
HCT: 40.8 % (ref 36.0–46.0)
HEMOGLOBIN: 12.8 g/dL (ref 12.0–15.0)
MCH: 28.3 pg (ref 26.0–34.0)
MCHC: 31.4 g/dL (ref 30.0–36.0)
MCV: 90.3 fL (ref 78.0–100.0)
Platelets: 332 10*3/uL (ref 150–400)
RBC: 4.52 MIL/uL (ref 3.87–5.11)
RDW: 13.5 % (ref 11.5–15.5)
WBC: 9.5 10*3/uL (ref 4.0–10.5)

## 2017-12-10 LAB — STREP PNEUMONIAE URINARY ANTIGEN: Strep Pneumo Urinary Antigen: NEGATIVE

## 2017-12-10 LAB — HIV ANTIBODY (ROUTINE TESTING W REFLEX): HIV Screen 4th Generation wRfx: NONREACTIVE

## 2017-12-10 LAB — BASIC METABOLIC PANEL
ANION GAP: 10 (ref 5–15)
BUN: 11 mg/dL (ref 6–20)
CHLORIDE: 101 mmol/L (ref 101–111)
CO2: 24 mmol/L (ref 22–32)
Calcium: 8.3 mg/dL — ABNORMAL LOW (ref 8.9–10.3)
Creatinine, Ser: 0.74 mg/dL (ref 0.44–1.00)
GFR calc Af Amer: >60 mL/min (ref >60)
GLUCOSE: 92 mg/dL (ref 65–99)
POTASSIUM: 3.5 mmol/L (ref 3.5–5.1)
Sodium: 135 mmol/L (ref 135–145)

## 2017-12-10 LAB — ECHOCARDIOGRAM COMPLETE
HEIGHTINCHES: 64 in
WEIGHTICAEL: 5502.4 [oz_av]

## 2017-12-10 LAB — MAGNESIUM: Magnesium: 1.9 mg/dL (ref 1.7–2.4)

## 2017-12-10 MED ORDER — METOPROLOL TARTRATE 100 MG PO TABS: 100.0000 mg | ORAL_TABLET | Freq: Two times a day (BID) | ORAL | 1 refills | Status: AC

## 2017-12-10 MED ORDER — BENZONATATE 100 MG PO CAPS: 200.0000 mg | ORAL_CAPSULE | Freq: Three times a day (TID) | ORAL | Status: DC | PRN

## 2017-12-10 MED ORDER — BENZONATATE 200 MG PO CAPS: 200.0000 mg | ORAL_CAPSULE | Freq: Three times a day (TID) | ORAL | 0 refills | Status: AC | PRN

## 2017-12-10 MED ORDER — IPRATROPIUM-ALBUTEROL 0.5-2.5 (3) MG/3ML IN SOLN
3.0000 mL | Freq: Three times a day (TID) | RESPIRATORY_TRACT | Status: DC
  Administered 2017-12-10: 3 mL via RESPIRATORY_TRACT
  Filled 2017-12-10: qty 3

## 2017-12-10 MED ORDER — BENZONATATE 200 MG PO CAPS: 200.0000 mg | ORAL_CAPSULE | Freq: Three times a day (TID) | ORAL | 0 refills | Status: DC | PRN

## 2017-12-10 MED ORDER — LISINOPRIL-HYDROCHLOROTHIAZIDE 20-25 MG PO TABS: 1.0000 | ORAL_TABLET | Freq: Every day | ORAL | 1 refills | Status: AC

## 2017-12-10 MED ORDER — HYDRALAZINE HCL 25 MG PO TABS
25.0000 mg | ORAL_TABLET | Freq: Three times a day (TID) | ORAL | Status: DC
  Administered 2017-12-10: 25 mg via ORAL
  Filled 2017-12-10: qty 1

## 2017-12-10 MED ORDER — AMLODIPINE BESYLATE 10 MG PO TABS: 10.0000 mg | ORAL_TABLET | Freq: Every day | ORAL | 1 refills | Status: AC

## 2017-12-10 NOTE — Progress Notes (Signed)
Patient is to be discharged home and in stable condition. IV and telemetry removed, WNL. Patient given discharge instructions and verbalized understanding. Patient will be escorted out by staff via wheelchair when ready.   Valor [place] P Dishmon, RN  

## 2017-12-10 NOTE — Discharge Summary (Signed)
Physician Discharge Summary  Misty Delgado ZOX:[id number] DOB: Dec 13, 1970 DOA: 12/08/2017  PCP: System, Pcp Not In  Admit date: 12/08/2017 Discharge date: 12/10/2017  Admitted From: Home Disposition:  Home  Recommendations for Outpatient Follow-up:  1. Follow up with PCP in 1-2 weeks 2. Please obtain BMP/CBC in one week    Discharge Condition: Stable CODE STATUS: FULL Diet recommendation: Heart Healthy / Carb Modified   Brief/Interim Summary: 47 y.o.femalewith medical history of hypertension, diabetes mellitus, and morbid obesity presenting with 3-day history of shortness of breath, coughing, fevers and chills. The patient went to urgent care on 12/06/2017. The patient was given anti-tussive and doxycycline.She subsequently followed up with her primary care provider on 12/07/2017 who started her on azithromycin. The patient continued to have a nonproductive cough with shortness of breath. As result, she presented for further evaluation. In addition, she has had orthopnea and PND type symptoms. She has had to sleep sitting up for the past 2-3 nights. The patient also endorses increasing lower extremity edema over the past month. She denies any headache, neck pain, hemoptysis, nausea, vomiting, diarrhea, dysuria, hematuria. She has some abdominal pain with coughing.   In the emergency department, the patient was noted to have oxygen saturation of 84% on room air. Oxygen saturation improved to 96% on 3 L. She was noted to have a fever 103.9 F with tachycardia up to 110. She was started on ceftriaxone and azithromycin for concerning pneumonia. Chest x-ray showed right lower lobe opacity and pulmonary vascular congestion.  In retrospect, the patient stated that she has been around her grandson who was recently diagnosed with RSV.    Discharge Diagnoses:  Sepsis -Secondary to pneumonia -Check lactic acid1.31 -Check procalcitonin--0.22 -presented with fever and  tachycardia -12/08/17--blood cultures negative to date  Acute respiratory failure with hypoxia -Secondary to pneumonia with a degree of pulmonary vascular congestion -Lasix 40 mg IV x1 12/08/17 -Presently stable on 2 L nasal cannula-->weaned to RA -ambulatory pulse ox did not show desaturation -Echo--EF 60-65%, no WMA, moderate LVH  Lobar pneumonia -suspect viral -viral respiratory panel +RSV -PCT --0.22 -Discontinue ceftriaxone -finished azithromycin--3 days total -Continue bronchodilators  Essential hypertension -continue amlodipineand metoprolol tartrate -Patient subsequently reveals she was not taking her antihypertensive medications properly at home.  She was trying to stretch them out secondary to financial reasons. -restart lisinopril HCTZ  Impaired glucose tolerance -Hemoglobin A1c--5.7 -NovoLog sliding scale -discontinue glyburide--has not taken in over 1 year -f/u PCP  Morbid obesity -BMI 61.25  Hypokalemia -replete -check mag--1.9    Discharge Instructions  Discharge Instructions    Diet - low sodium heart healthy   Complete by:  As directed    Increase activity slowly   Complete by:  As directed      Allergies as of 12/10/2017      Reactions   Penicillins Hives   Has patient had a PCN reaction causing immediate rash, facial/tongue/throat swelling, SOB or lightheadedness with hypotension: No Has patient had a PCN reaction causing severe rash involving mucus membranes or skin necrosis: No Has patient had a PCN reaction that required hospitalization: Yes Has patient had a PCN reaction occurring within the last 10 years: No If all of the above answers are "NO", then may proceed with Cephalosporin use.      Medication List    STOP taking these medications   doxycycline 100 MG tablet Commonly known as:  VIBRA-TABS     TAKE these medications   benzonatate 200 MG capsule Commonly known as:  TESSALON Take 1 capsule (200 mg total) by mouth 3  (three) times daily as needed for cough. What changed:    medication strength  how much to take   cholecalciferol 1000 units tablet Commonly known as:  VITAMIN D Take 2,000 Units by mouth daily.   glyBURIDE 2.5 MG tablet Commonly known as:  DIABETA Take 2.5 mg by mouth daily with breakfast.   lisinopril-hydrochlorothiazide 20-25 MG tablet Commonly known as:  PRINZIDE,ZESTORETIC Take 1 tablet by mouth daily.   metoprolol tartrate 100 MG tablet Commonly known as:  LOPRESSOR Take 100 mg by mouth 2 (two) times daily.   NORVASC 10 MG tablet Generic drug:  amLODipine take 1 tablet daily   PROAIR HFA 108 (90 Base) MCG/ACT inhaler Generic drug:  albuterol Inhale 1-2 puffs into the lungs every 6 (six) hours as needed for wheezing or shortness of breath.   Vitamin D (Ergocalciferol) 50000 units Caps capsule Commonly known as:  DRISDOL Take 50,000 Units by mouth 2 (two) times a week.       Allergies  Allergen Reactions  . Penicillins Hives    Has patient had a PCN reaction causing immediate rash, facial/tongue/throat swelling, SOB or lightheadedness with hypotension: No Has patient had a PCN reaction causing severe rash involving mucus membranes or skin necrosis: No Has patient had a PCN reaction that required hospitalization: Yes Has patient had a PCN reaction occurring within the last 10 years: No If all of the above answers are "NO", then may proceed with Cephalosporin use.     Consultations:  none   Procedures/Studies: Dg Chest 2 View  Result Date: 12/08/2017 CLINICAL DATA:  Cough, shortness of breath. EXAM: CHEST  2 VIEW COMPARISON:  None. FINDINGS: Borderline cardiac enlargement is noted with central pulmonary vascular congestion. No pneumothorax or pleural effusion is noted. Mild right basilar subsegmental atelectasis or infiltrate is noted. Bony thorax is unremarkable. IMPRESSION: Central pulmonary vascular congestion is noted. Mild right basilar atelectasis or  infiltrate is noted. Followup PA and lateral chest X-ray is recommended in 3-4 weeks following trial of antibiotic therapy to ensure resolution and exclude underlying malignancy. Electronically Signed   By: Lupita RaiderJames  Green Jr, M.D.   On: 12/08/2017 14:05         Discharge Exam: Vitals:   12/10/17 1300 12/10/17 1513  BP: (!) 155/80   Pulse: 78   Resp: 19   Temp: 98.5 F (36.9 C)   SpO2: 97% 96%   Vitals:   12/10/17 0600 12/10/17 0800 12/10/17 1300 12/10/17 1513  BP: (!) 188/93  (!) 155/80   Pulse: 76  78   Resp: 20  19   Temp: 98.8 F (37.1 C)  98.5 F (36.9 C)   TempSrc: Oral  Oral   SpO2: 99% 98% 97% 96%  Weight:      Height:        General: Pt is alert, awake, not in acute distress Cardiovascular: RRR, S1/S2 +, no rubs, no gallops Respiratory: CTA bilaterally, no wheezing, no rhonchi Abdominal: Soft, NT, ND, bowel sounds + Extremities: no edema, no cyanosis   The results of significant diagnostics from this hospitalization (including imaging, microbiology, ancillary and laboratory) are listed below for reference.    Significant Diagnostic Studies: Dg Chest 2 View  Result Date: 12/08/2017 CLINICAL DATA:  Cough, shortness of breath. EXAM: CHEST  2 VIEW COMPARISON:  None. FINDINGS: Borderline cardiac enlargement is noted with central pulmonary vascular congestion. No pneumothorax or pleural effusion is noted. Mild right basilar subsegmental  atelectasis or infiltrate is noted. Bony thorax is unremarkable. IMPRESSION: Central pulmonary vascular congestion is noted. Mild right basilar atelectasis or infiltrate is noted. Followup PA and lateral chest X-ray is recommended in 3-4 weeks following trial of antibiotic therapy to ensure resolution and exclude underlying malignancy. Electronically Signed   By: Lupita Raider, M.D.   On: 12/08/2017 14:05     Microbiology: Recent Results (from the past 240 hour(s))  Blood Culture (routine x 2)     Status: None (Preliminary result)    Collection Time: 12/08/17  1:59 PM  Result Value Ref Range Status   Specimen Description BLOOD RIGHT ANTECUBITAL  Final   Special Requests   Final    BOTTLES DRAWN AEROBIC AND ANAEROBIC Blood Culture adequate volume   Culture NO GROWTH 2 DAYS  Final   Report Status PENDING  Incomplete  Blood Culture (routine x 2)     Status: None (Preliminary result)   Collection Time: 12/08/17  2:13 PM  Result Value Ref Range Status   Specimen Description BLOOD LEFT ANTECUBITAL  Final   Special Requests   Final    BOTTLES DRAWN AEROBIC ONLY Blood Culture adequate volume   Culture NO GROWTH 2 DAYS  Final   Report Status PENDING  Incomplete  Respiratory Panel by PCR     Status: Abnormal   Collection Time: 12/08/17  8:37 PM  Result Value Ref Range Status   Adenovirus NOT DETECTED NOT DETECTED Final   Coronavirus 229E NOT DETECTED NOT DETECTED Final   Coronavirus HKU1 NOT DETECTED NOT DETECTED Final   Coronavirus NL63 NOT DETECTED NOT DETECTED Final   Coronavirus OC43 DETECTED (A) NOT DETECTED Final   Metapneumovirus NOT DETECTED NOT DETECTED Final   Rhinovirus / Enterovirus NOT DETECTED NOT DETECTED Final   Influenza A NOT DETECTED NOT DETECTED Final   Influenza B NOT DETECTED NOT DETECTED Final   Parainfluenza Virus 1 NOT DETECTED NOT DETECTED Final   Parainfluenza Virus 2 NOT DETECTED NOT DETECTED Final   Parainfluenza Virus 3 NOT DETECTED NOT DETECTED Final   Parainfluenza Virus 4 NOT DETECTED NOT DETECTED Final   Respiratory Syncytial Virus DETECTED (A) NOT DETECTED Final    Comment: CRITICAL RESULT CALLED TO, READ BACK BY AND VERIFIED WITH: A HOBBS,RN AT 1640 12/09/17 BY L BENFIELD    Bordetella pertussis NOT DETECTED NOT DETECTED Final   Chlamydophila pneumoniae NOT DETECTED NOT DETECTED Final   Mycoplasma pneumoniae NOT DETECTED NOT DETECTED Final    Comment: Performed at [facility], [address], [place], [place] [address]     Labs: Basic Metabolic Panel: Recent Labs  Lab  12/08/17 1357 12/09/17 0458 12/10/17 0655  NA 136 134* 135  K 3.6 3.2* 3.5  CL 103 99* 101  CO2 22 23 24   GLUCOSE 126* 102* 92  BUN 9 13 11   CREATININE 0.97 1.01* 0.74  CALCIUM 8.5* 8.2* 8.3*  MG  --   --  1.9   Liver Function Tests: Recent Labs  Lab 12/09/17 0458  AST 53*  ALT 29  ALKPHOS 100  BILITOT 1.0  PROT 8.5*  ALBUMIN 2.9*   No results for input(s): LIPASE, AMYLASE in the last 168 hours. No results for input(s): AMMONIA in the last 168 hours. CBC: Recent Labs  Lab 12/08/17 1357 12/09/17 0458 12/10/17 0655  WBC 14.1* 11.0* 9.5  NEUTROABS 11.4*  --   --   HGB 13.1 13.0 12.8  HCT 40.7 40.6 40.8  MCV 90.4 90.8 90.3  PLT 362 356 332   Cardiac Enzymes: No results for input(s): CKTOTAL, CKMB, CKMBINDEX, TROPONINI in the last 168 hours. BNP: Invalid input(s): POCBNP CBG: No results for input(s): GLUCAP in the last 168 hours.  Time coordinating discharge:  Greater than 30 minutes  Signed:  Catarina Hartshorn, DO [facility] Pager: [phone] 12/10/2017, 3:53 PM

## 2017-12-10 NOTE — Progress Notes (Signed)
*  PRELIMINARY RESULTS* Echocardiogram 2D Echocardiogram has been performed.  Jeryl Columbialliott, Heyli Min 12/10/2017, 2:06 PM

## 2017-12-11 LAB — LEGIONELLA PNEUMOPHILA SEROGP 1 UR AG: L. pneumophila Serogp 1 Ur Ag: NEGATIVE

## 2017-12-13 LAB — CULTURE, BLOOD (ROUTINE X 2)
Culture: NO GROWTH
Culture: NO GROWTH
Special Requests: ADEQUATE
Special Requests: ADEQUATE

## 2018-08-09 IMAGING — DX DG CHEST 2V
2 series · 2 of 2 positions shown · non-contrast
Comparison: None.

CLINICAL DATA: Cough, shortness of breath.

EXAM:
CHEST  2 VIEW

[chest pa]
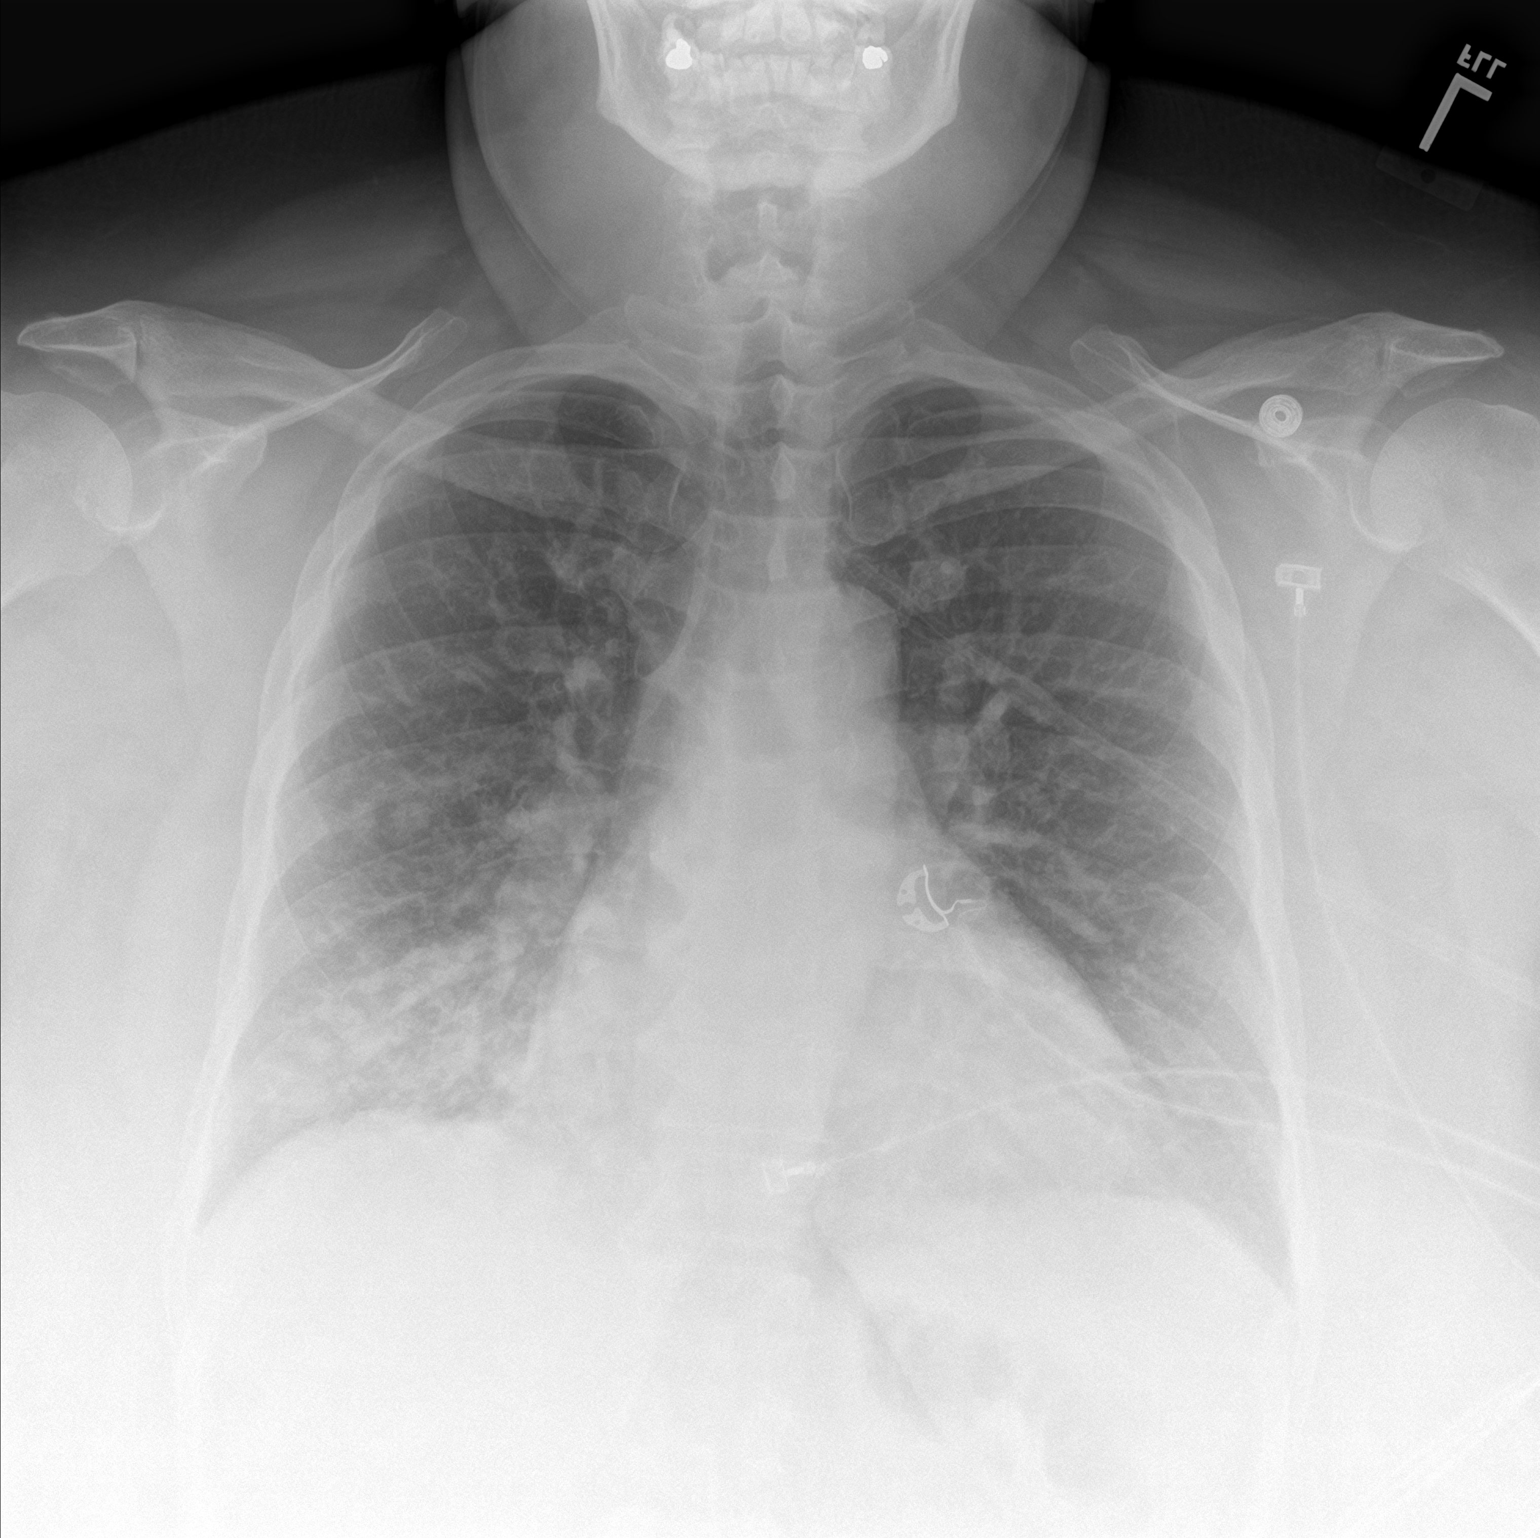

[chest lat]
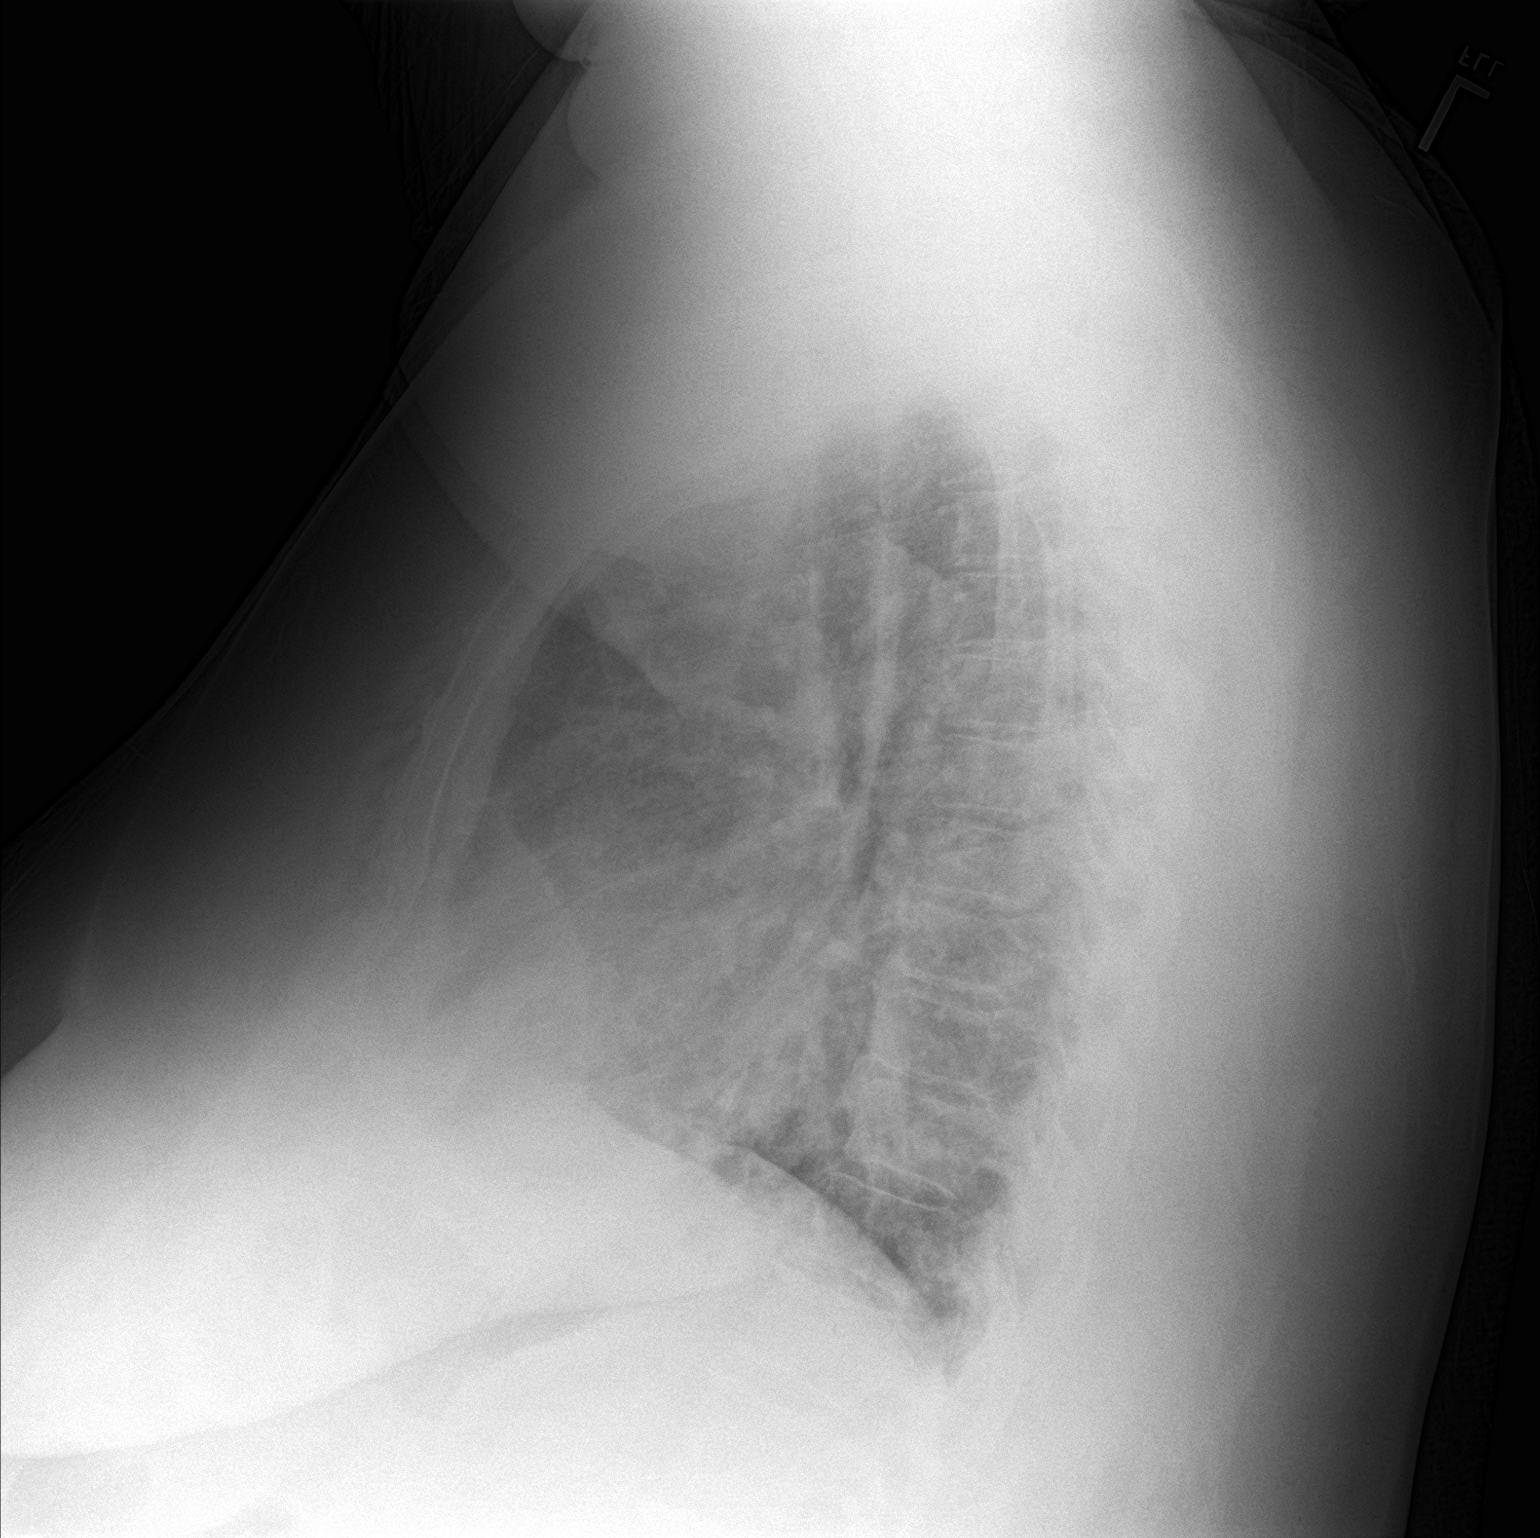

[2 of 2 positions shown; findings below may reference images not displayed]

FINDINGS: Borderline cardiac enlargement is noted with central pulmonary
vascular congestion. No pneumothorax or pleural effusion is noted.
Mild right basilar subsegmental atelectasis or infiltrate is noted.
Bony thorax is unremarkable.
IMPRESSION: Central pulmonary vascular congestion is noted. Mild right basilar
atelectasis or infiltrate is noted. Followup PA and lateral chest
X-ray is recommended in 3-4 weeks following trial of antibiotic
therapy to ensure resolution and exclude underlying malignancy.
